# Patient Record
Sex: Male | Born: 1955 | Race: White | Hispanic: No | Marital: Married | Smoking: Never smoker
Health system: Southern US, Community
[De-identification: ages and names within clinical notes are randomized; demographics above are authoritative.]

## PROBLEM LIST (undated history)

## (undated) DIAGNOSIS — T7840XA Allergy, unspecified, initial encounter: Secondary | ICD-10-CM

## (undated) DIAGNOSIS — K219 Gastro-esophageal reflux disease without esophagitis: Secondary | ICD-10-CM

## (undated) DIAGNOSIS — I10 Essential (primary) hypertension: Secondary | ICD-10-CM

## (undated) DIAGNOSIS — G40909 Epilepsy, unspecified, not intractable, without status epilepticus: Secondary | ICD-10-CM

## (undated) DIAGNOSIS — M545 Low back pain, unspecified: Secondary | ICD-10-CM

## (undated) DIAGNOSIS — Z8719 Personal history of other diseases of the digestive system: Secondary | ICD-10-CM

## (undated) HISTORY — DX: Allergy, unspecified, initial encounter: T78.40XA

## (undated) HISTORY — PX: WRIST SURGERY: SHX841

## (undated) HISTORY — DX: Epilepsy, unspecified, not intractable, without status epilepticus: G40.909

## (undated) HISTORY — PX: CHOLECYSTECTOMY: SHX55

## (undated) HISTORY — PX: TOE SURGERY: SHX1073

## (undated) HISTORY — PX: COLONOSCOPY: SHX5424

## (undated) HISTORY — DX: Low back pain, unspecified: M54.50

## (undated) HISTORY — PX: CARDIAC CATHETERIZATION: SHX172

## (undated) HISTORY — DX: Low back pain: M54.5

---
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
## 1998-11-07 HISTORY — PX: AMPUTATION: SHX166

## 2016-04-26 ENCOUNTER — Other Ambulatory Visit: Payer: Self-pay

## 2016-04-27 ENCOUNTER — Telehealth: Payer: Self-pay | Admitting: Vascular Surgery

## 2016-04-27 NOTE — Telephone Encounter (Signed)
-----   Message from Phillips Odorarol S Pullins, RN sent at 04/26/2016 12:05 PM EDT ----- Regarding: needs appt. with Dr. Arbie CookeyEarly Please schedule this pt. for a new pt. Consult with Dr. Arbie CookeyEarly, prior to ALIF 05/18/16.  (Dr. Arbie CookeyEarly will be assisting Dr. Shon BatonBrooks)  Please remind the pt. To bring copy of LS spine films to appt.

## 2016-04-27 NOTE — Telephone Encounter (Signed)
Scheduled for consult for ALIF on 05/18/16 LVM on mobile #

## 2016-05-02 ENCOUNTER — Encounter: Payer: Self-pay | Admitting: Vascular Surgery

## 2016-05-03 ENCOUNTER — Ambulatory Visit (INDEPENDENT_AMBULATORY_CARE_PROVIDER_SITE_OTHER): Payer: BLUE CROSS/BLUE SHIELD | Admitting: Vascular Surgery

## 2016-05-03 ENCOUNTER — Encounter: Payer: Self-pay | Admitting: Vascular Surgery

## 2016-05-03 VITALS — BP 151/101 | HR 92 | Temp 97.2°F | Resp 18 | Ht 68.5 in | Wt 226.3 lb

## 2016-05-03 DIAGNOSIS — M5136 Other intervertebral disc degeneration, lumbar region: Secondary | ICD-10-CM

## 2016-05-03 NOTE — Progress Notes (Signed)
Vitals:   05/03/16 1004 05/03/16 1007  BP: (!) 158/100 (!) 151/101  Pulse: 92   Resp: 18   Temp: 97.2 F (36.2 C)   TempSrc: Oral   SpO2: 96%   Weight: 226 lb 4.8 oz (102.6 kg)   Height: 5' 8.5" (1.74 m)

## 2016-05-03 NOTE — Progress Notes (Signed)
Patient name: Phillip CheersLarry Hendrix MRN: [id number] DOB: 07/24/1956 Sex: male  REASON FOR CONSULT: Discussed anterior exposure for L4-5 degenerative disc disease  HPI: Phillip Hendrix is a 60 y.o. male, here today for discussion of anterior exposure for L4-5 disc surgery. He is had failure of conservative treatment. This is included several courses of injections. He reports this pain is been present for several years but is been much more intense over the past 9 months. This is in his low back and extends mainly down into his left leg. This does make it difficult for him to do his routine daily activities and work issues. He is been seen by Dr. Shon BatonBrooks was recommended anterior exposure for L4-5 disc surgery. He has had no prior abdominal surgery to include appendectomy in 1972 and laparoscopic cholecystectomy. No other intra-abdominal surgery. He has no history of heart disease and no other major medical difficulties.  Past Medical History:  Diagnosis Date  . Allergy   . Low back pain   . Seizure disorder (HCC)    07-2013, 1995    Family History  Problem Relation Age of Onset  . Heart disease Mother   . Cancer Sister   . Diabetes Sister   . Diabetes Brother   . Cancer Maternal Grandfather     SOCIAL HISTORY: Social History   Social History  . Marital status: Married    Spouse name: N/A  . Number of children: N/A  . Years of education: N/A   Occupational History  . Not on file.   Social History Main Topics  . Smoking status: Never Smoker  . Smokeless tobacco: Never Used  . Alcohol use No  . Drug use: No  . Sexual activity: Not on file   Other Topics Concern  . Not on file   Social History Narrative  . No narrative on file    Allergies  Allergen Reactions  . Celecoxib Rash    Current Outpatient Prescriptions  Medication Sig Dispense Refill  . allopurinol (ZYLOPRIM) 100 MG tablet TAKE 1 TABLET BY MOUTH EVERY MORNING FOR 1 WEEK THEN TAKE 2 TABLETS BY MOUTH EVERY MORNING  11   . HYDROcodone-acetaminophen (NORCO) 10-325 MG tablet TAKE 1 TABLET BY MOUTH 4 TIMES A DAY AS NEEDED  0  . loratadine (CLARITIN) 10 MG tablet Take 10 mg by mouth.    . pantoprazole (PROTONIX) 40 MG tablet Take 40 mg by mouth daily.  3  . Potassium 99 MG TABS Take by mouth.    . zolpidem (AMBIEN) 10 MG tablet Take 10 mg by mouth at bedtime.  5   No current facility-administered medications for this visit.     REVIEW OF SYSTEMS:  [X]  denotes positive finding, [ ]  denotes negative finding Cardiac  Comments:  Chest pain or chest pressure:    Shortness of breath upon exertion:    Short of breath when lying flat:    Irregular heart rhythm:        Vascular    Pain in calf, thigh, or hip brought on by ambulation: x   Pain in feet at night that wakes you up from your sleep:     Blood clot in your veins:    Leg swelling:         Pulmonary    Oxygen at home:    Productive cough:     Wheezing:         Neurologic    Sudden weakness in arms or legs:  Sudden numbness in arms or legs:     Sudden onset of difficulty speaking or slurred speech:    Temporary loss of vision in one eye:     Problems with dizziness:         Gastrointestinal    Blood in stool:     Vomited blood:         Genitourinary    Burning when urinating:     Blood in urine:        Psychiatric    Major depression:         Hematologic    Bleeding problems:    Problems with blood clotting too easily:        Skin    Rashes or ulcers:        Constitutional    Fever or chills:      PHYSICAL EXAM: Vitals:   05/03/16 1004 05/03/16 1007  BP: (!) 158/100 (!) 151/101  Pulse: 92   Resp: 18   Temp: 97.2 F (36.2 C)   TempSrc: Oral   SpO2: 96%   Weight: 102.6 kg (226 lb 4.8 oz)   Height: 5' 8.5" (1.74 m)     GENERAL: The patient is a well-nourished male, in no acute distress. The vital signs are documented above. CARDIAC: There is a regular rate and rhythm.  VASCULAR: 2+ right radial pulse and 2+  dorsalis pedis pulses bilaterally PULMONARY: There is good air exchange bilaterally without wheezing or rales. ABDOMEN: Soft and non-tender with normal pitched bowel sounds. No masses noted. MUSCULOSKELETAL: Left forearm amputation. NEUROLOGIC: No focal weakness or paresthesias are detected. SKIN: There are no ulcers or rashes noted. PSYCHIATRIC: The patient has a normal affect.  DATA:   I do not have any imaging studies from [facility] orthopedics. Will obtain these. He does not know if he has had an MR of his back or CT scan. Does note he has had plain films.  MEDICAL ISSUES:  Degenerative disc disease with failure of conservative treatment. I explained my role for anterior exposure. Explain mobilization of intraperitoneal contents, left ureter and arterial venous structures in the potential injury of all of these. We'll obtain films from [facility] orthopedics prior to surgery. Explained I do not see any contraindication or concern assuming he does not have extreme calcification of his aorta. This would be unlikely since he is never smoked and has a normal distal pulses. We'll plan surgery on 05/18/2016 as scheduled   Zamiyah Resendes [facility] [employer] [facility] [phone]

## 2016-05-09 NOTE — Pre-Procedure Instructions (Signed)
Phillip BuccoLarry E Hendrix  05/09/2016      [id number] [place], [place] - [address] [place] [id number] Phone: [phone] Fax: [phone]    Your procedure is scheduled on Oct. 11  [facility] Admitting at 800 A.M.  Call this number if you have problems the morning of surgery:  [phone]   Remember:  Do not eat food or drink liquids after midnight.  Take these medicines the morning of surgery with A SIP OF WATER Allopurinol (Zyloprim), Hydrocodone (Norco) if needed, Loratadine (Claritin), Pantoprazole (Protonix)  Stop taking aspirin, BC's, Goody's, Herbal medications, Fish Oil, Aleve, Ibuprofen, Advil, Motrin, Vitamins    Do not wear jewelry, make-up or nail polish.  Do not wear lotions, powders, or perfumes, or deoderant.  Do not shave 48 hours prior to surgery.  Men may shave face and neck.  Do not bring valuables to the hospital.  [facility] is not responsible for any belongings or valuables.  Contacts, dentures or bridgework may not be worn into surgery.  Leave your suitcase in the car.  After surgery it may be brought to your room.  For patients admitted to the hospital, discharge time will be determined by your treatment team.  Patients discharged the day of surgery will not be allowed to drive home.   Special instructions:  [place] - Preparing for Surgery  Before surgery, you can play an important role.  Because skin is not sterile, your skin needs to be as free of germs as possible.  You can reduce the number of germs on you skin by washing with CHG (chlorahexidine gluconate) soap before surgery.  CHG is an antiseptic cleaner which kills germs and bonds with the skin to continue killing germs even after washing.  Please DO NOT use if you have an allergy to CHG or antibacterial soaps.  If your skin becomes reddened/irritated stop using the CHG and inform your nurse when you arrive at Short Stay.  Do not shave  (including legs and underarms) for at least 48 hours prior to the first CHG shower.  You may shave your face.  Please follow these instructions carefully:   1.  Shower with CHG Soap the night before surgery and the    morning of Surgery.  2.  If you choose to wash your hair, wash your hair first as usual with your   normal shampoo.  3.  After you shampoo, rinse your hair and body thoroughly to remove the  Shampoo.  4.  Use CHG as you would any other liquid soap.  You can apply chg directly to the skin and wash gently with scrungie or a clean washcloth.  5.  Apply the CHG Soap to your body ONLY FROM THE NECK DOWN.    Do not use on open wounds or open sores.  Avoid contact with your eyes,       ears, mouth and genitals (private parts).  Wash genitals (private parts) with your normal soap.  6.  Wash thoroughly, paying special attention to the area where your surgery will be performed.  7.  Thoroughly rinse your body with warm water from the neck down.  8.  DO NOT shower/wash with your normal soap after using and rinsing off  the CHG Soap.  9.  Pat yourself dry with a clean towel.            10.  Wear clean pajamas.  11.  Place clean sheets on your bed the night of your first shower and do not sleep with pets.  Day of Surgery  Do not apply any lotions/deoderants the morning of surgery.  Please wear clean clothes to the hospital/surgery center.     Please read over the following fact sheets that you were given. Pain Booklet, MRSA Information and Surgical Site Infection Prevention

## 2016-05-10 ENCOUNTER — Encounter (HOSPITAL_COMMUNITY)
Admission: RE | Admit: 2016-05-10 | Discharge: 2016-05-10 | Disposition: A | Discharge status: Home / Self Care | Payer: BLUE CROSS/BLUE SHIELD | Source: Ambulatory Visit | Attending: Orthopedic Surgery | Admitting: Orthopedic Surgery

## 2016-05-10 ENCOUNTER — Encounter (HOSPITAL_COMMUNITY): Payer: Self-pay

## 2016-05-10 DIAGNOSIS — M5136 Other intervertebral disc degeneration, lumbar region: Secondary | ICD-10-CM | POA: Insufficient documentation

## 2016-05-10 DIAGNOSIS — Z01810 Encounter for preprocedural cardiovascular examination: Secondary | ICD-10-CM | POA: Insufficient documentation

## 2016-05-10 DIAGNOSIS — Z01812 Encounter for preprocedural laboratory examination: Secondary | ICD-10-CM | POA: Insufficient documentation

## 2016-05-10 HISTORY — DX: Gastro-esophageal reflux disease without esophagitis: K21.9

## 2016-05-10 HISTORY — DX: Essential (primary) hypertension: I10

## 2016-05-10 HISTORY — DX: Personal history of other diseases of the digestive system: Z87.19

## 2016-05-10 LAB — BASIC METABOLIC PANEL
ANION GAP: 8 (ref 5–15)
BUN: 7 mg/dL (ref 6–20)
CALCIUM: 9.3 mg/dL (ref 8.9–10.3)
CO2: 25 mmol/L (ref 22–32)
CREATININE: 0.9 mg/dL (ref 0.61–1.24)
Chloride: 101 mmol/L (ref 101–111)
GFR calc Af Amer: >60 mL/min (ref >60)
GLUCOSE: 101 mg/dL — AB (ref 65–99)
Potassium: 4.1 mmol/L (ref 3.5–5.1)
Sodium: 134 mmol/L — ABNORMAL LOW (ref 135–145)

## 2016-05-10 LAB — CBC
HEMATOCRIT: 44.5 % (ref 39.0–52.0)
Hemoglobin: 15.4 g/dL (ref 13.0–17.0)
MCH: 32.6 pg (ref 26.0–34.0)
MCHC: 34.6 g/dL (ref 30.0–36.0)
MCV: 94.1 fL (ref 78.0–100.0)
PLATELETS: 149 10*3/uL — AB (ref 150–400)
RBC: 4.73 MIL/uL (ref 4.22–5.81)
RDW: 12.5 % (ref 11.5–15.5)
WBC: 8 10*3/uL (ref 4.0–10.5)

## 2016-05-10 LAB — SURGICAL PCR SCREEN
MRSA, PCR: NEGATIVE
Staphylococcus aureus: NEGATIVE

## 2016-05-10 NOTE — Progress Notes (Signed)
   05/10/16 1212  OBSTRUCTIVE SLEEP APNEA  Have you ever been diagnosed with sleep apnea through a sleep study? No  Do you snore loudly (loud enough to be heard through closed doors)?  0  Do you often feel tired, fatigued, or sleepy during the daytime (such as falling asleep during driving or talking to someone)? 0  Has anyone observed you stop breathing during your sleep? 1  Do you have, or are you being treated for high blood pressure? 0  BMI more than 35 kg/m2? 1  Age > 50 (1-yes) 1  Neck circumference greater than:Male 16 inches or larger, Male 17inches or larger? 1  Male Gender (Yes=1) 1  Obstructive Sleep Apnea Score 5  Score 5 or greater  Results sent to PCP

## 2016-05-10 NOTE — Progress Notes (Addendum)
PCP is Dr Council MechanicPatrick Hendrix Denies ever seeing a cardiologist. Denies having any recent chest pain. Denies ever having a  Recent card cath, stress test and echo., states he had a card cath and stress test 10 years ago or more.

## 2016-05-17 ENCOUNTER — Encounter (HOSPITAL_COMMUNITY): Payer: Self-pay | Admitting: Anesthesiology

## 2016-05-18 ENCOUNTER — Encounter (HOSPITAL_COMMUNITY): Payer: Self-pay | Admitting: *Deleted

## 2016-05-18 ENCOUNTER — Inpatient Hospital Stay (HOSPITAL_COMMUNITY): Payer: BLUE CROSS/BLUE SHIELD | Admitting: Anesthesiology

## 2016-05-18 ENCOUNTER — Inpatient Hospital Stay (HOSPITAL_COMMUNITY): Payer: BLUE CROSS/BLUE SHIELD

## 2016-05-18 ENCOUNTER — Encounter (HOSPITAL_COMMUNITY)
Admission: RE | Disposition: A | Discharge status: Home / Self Care | Payer: Self-pay | Source: Ambulatory Visit | Attending: Orthopedic Surgery

## 2016-05-18 ENCOUNTER — Inpatient Hospital Stay (HOSPITAL_COMMUNITY)
Admission: RE | Admit: 2016-05-18 | Discharge: 2016-05-19 | DRG: 460 | Disposition: A | Discharge status: Home / Self Care | Payer: BLUE CROSS/BLUE SHIELD | Source: Ambulatory Visit | Attending: Orthopedic Surgery | Admitting: Orthopedic Surgery

## 2016-05-18 DIAGNOSIS — M5116 Intervertebral disc disorders with radiculopathy, lumbar region: Secondary | ICD-10-CM | POA: Diagnosis present

## 2016-05-18 DIAGNOSIS — M549 Dorsalgia, unspecified: Secondary | ICD-10-CM | POA: Diagnosis present

## 2016-05-18 DIAGNOSIS — I10 Essential (primary) hypertension: Secondary | ICD-10-CM | POA: Diagnosis present

## 2016-05-18 DIAGNOSIS — M545 Low back pain: Secondary | ICD-10-CM | POA: Diagnosis present

## 2016-05-18 DIAGNOSIS — T849XXA Unspecified complication of internal orthopedic prosthetic device, implant and graft, initial encounter: Secondary | ICD-10-CM

## 2016-05-18 DIAGNOSIS — K219 Gastro-esophageal reflux disease without esophagitis: Secondary | ICD-10-CM | POA: Diagnosis present

## 2016-05-18 DIAGNOSIS — M48061 Spinal stenosis, lumbar region without neurogenic claudication: Secondary | ICD-10-CM | POA: Diagnosis present

## 2016-05-18 DIAGNOSIS — Z6834 Body mass index (BMI) 34.0-34.9, adult: Secondary | ICD-10-CM | POA: Diagnosis not present

## 2016-05-18 DIAGNOSIS — E669 Obesity, unspecified: Secondary | ICD-10-CM | POA: Diagnosis present

## 2016-05-18 DIAGNOSIS — Z79899 Other long term (current) drug therapy: Secondary | ICD-10-CM | POA: Diagnosis not present

## 2016-05-18 DIAGNOSIS — IMO0001 Reserved for inherently not codable concepts without codable children: Secondary | ICD-10-CM

## 2016-05-18 DIAGNOSIS — Z419 Encounter for procedure for purposes other than remedying health state, unspecified: Secondary | ICD-10-CM

## 2016-05-18 SURGERY — POSTERIOR LUMBAR FUSION 1 LEVEL
Anesthesia: General

## 2016-05-18 MED ORDER — FENTANYL CITRATE (PF) 100 MCG/2ML IJ SOLN
INTRAMUSCULAR | Status: AC
  Filled 2016-05-18: qty 2

## 2016-05-18 MED ORDER — CEFAZOLIN SODIUM-DEXTROSE 2-3 GM-% IV SOLR
INTRAVENOUS | Status: DC | PRN
  Administered 2016-05-18 (×2): 2 g via INTRAVENOUS

## 2016-05-18 MED ORDER — FENTANYL CITRATE (PF) 100 MCG/2ML IJ SOLN
INTRAMUSCULAR | Status: DC | PRN
  Administered 2016-05-18 (×4): 50 ug via INTRAVENOUS
  Administered 2016-05-18: 100 ug via INTRAVENOUS

## 2016-05-18 MED ORDER — THROMBIN 20000 UNITS EX SOLR
CUTANEOUS | Status: DC | PRN
  Administered 2016-05-18: 20000 [IU] via TOPICAL

## 2016-05-18 MED ORDER — FENTANYL CITRATE (PF) 100 MCG/2ML IJ SOLN
INTRAMUSCULAR | Status: AC
  Filled 2016-05-18: qty 4

## 2016-05-18 MED ORDER — SODIUM CHLORIDE 0.9% FLUSH: 3.0000 mL | INTRAVENOUS | Status: DC | PRN

## 2016-05-18 MED ORDER — METHOCARBAMOL 1000 MG/10ML IJ SOLN
500.0000 mg | Freq: Four times a day (QID) | INTRAVENOUS | Status: DC | PRN
  Filled 2016-05-18: qty 5

## 2016-05-18 MED ORDER — METHOCARBAMOL 500 MG PO TABS
500.0000 mg | ORAL_TABLET | Freq: Four times a day (QID) | ORAL | Status: DC | PRN
  Administered 2016-05-18 – 2016-05-19 (×3): 500 mg via ORAL
  Filled 2016-05-18 (×2): qty 1

## 2016-05-18 MED ORDER — LACTATED RINGERS IV SOLN
INTRAVENOUS | Status: DC
Start: 2016-05-18 — End: 2016-05-18
  Administered 2016-05-18: 08:00:00 via INTRAVENOUS

## 2016-05-18 MED ORDER — ONDANSETRON HCL 4 MG/2ML IJ SOLN: 4.0000 mg | INTRAMUSCULAR | Status: DC | PRN

## 2016-05-18 MED ORDER — MIDAZOLAM HCL 5 MG/5ML IJ SOLN
INTRAMUSCULAR | Status: DC | PRN
  Administered 2016-05-18: 2 mg via INTRAVENOUS

## 2016-05-18 MED ORDER — PROMETHAZINE HCL 25 MG/ML IJ SOLN
6.2500 mg | INTRAMUSCULAR | Status: DC | PRN
Start: 2016-05-18 — End: 2016-05-18

## 2016-05-18 MED ORDER — PROPOFOL 10 MG/ML IV BOLUS
INTRAVENOUS | Status: AC
  Filled 2016-05-18: qty 20

## 2016-05-18 MED ORDER — LACTATED RINGERS IV SOLN
INTRAVENOUS | Status: DC | PRN
  Administered 2016-05-18 (×5): via INTRAVENOUS

## 2016-05-18 MED ORDER — BUPIVACAINE-EPINEPHRINE 0.25% -1:200000 IJ SOLN
INTRAMUSCULAR | Status: AC
  Filled 2016-05-18: qty 1

## 2016-05-18 MED ORDER — MIDAZOLAM HCL 2 MG/2ML IJ SOLN
INTRAMUSCULAR | Status: AC
  Filled 2016-05-18: qty 2

## 2016-05-18 MED ORDER — PHENOL 1.4 % MT LIQD: 1.0000 | OROMUCOSAL | Status: DC | PRN

## 2016-05-18 MED ORDER — ALLOPURINOL 100 MG PO TABS
200.0000 mg | ORAL_TABLET | Freq: Every morning | ORAL | Status: DC
  Administered 2016-05-19: 200 mg via ORAL
  Filled 2016-05-18: qty 2

## 2016-05-18 MED ORDER — PROPOFOL 10 MG/ML IV BOLUS
INTRAVENOUS | Status: DC | PRN
  Administered 2016-05-18: 150 mg via INTRAVENOUS

## 2016-05-18 MED ORDER — DEXAMETHASONE 4 MG PO TABS
4.0000 mg | ORAL_TABLET | Freq: Four times a day (QID) | ORAL | Status: DC
  Administered 2016-05-18 – 2016-05-19 (×3): 4 mg via ORAL
  Filled 2016-05-18 (×3): qty 1

## 2016-05-18 MED ORDER — LACTATED RINGERS IV SOLN: INTRAVENOUS | Status: DC

## 2016-05-18 MED ORDER — CHLORHEXIDINE GLUCONATE 4 % EX LIQD: 60.0000 mL | Freq: Once | CUTANEOUS | Status: DC

## 2016-05-18 MED ORDER — ONDANSETRON HCL 4 MG/2ML IJ SOLN
INTRAMUSCULAR | Status: AC
  Filled 2016-05-18: qty 2

## 2016-05-18 MED ORDER — CEFAZOLIN SODIUM-DEXTROSE 2-4 GM/100ML-% IV SOLN
2.0000 g | INTRAVENOUS | Status: DC
  Filled 2016-05-18: qty 100

## 2016-05-18 MED ORDER — DEXAMETHASONE SODIUM PHOSPHATE 4 MG/ML IJ SOLN: 4.0000 mg | Freq: Four times a day (QID) | INTRAMUSCULAR | Status: DC

## 2016-05-18 MED ORDER — HEMOSTATIC AGENTS (NO CHARGE) OPTIME
TOPICAL | Status: DC | PRN
  Administered 2016-05-18: 1 via TOPICAL

## 2016-05-18 MED ORDER — HYDROMORPHONE HCL 1 MG/ML IJ SOLN
INTRAMUSCULAR | Status: AC
  Administered 2016-05-18: 0.5 mg via INTRAVENOUS
  Filled 2016-05-18: qty 1

## 2016-05-18 MED ORDER — PANTOPRAZOLE SODIUM 40 MG PO TBEC
40.0000 mg | DELAYED_RELEASE_TABLET | Freq: Every day | ORAL | Status: DC
Start: 2016-05-18 — End: 2016-05-19
  Administered 2016-05-18: 40 mg via ORAL
  Filled 2016-05-18: qty 1

## 2016-05-18 MED ORDER — PHENYLEPHRINE 40 MCG/ML (10ML) SYRINGE FOR IV PUSH (FOR BLOOD PRESSURE SUPPORT)
PREFILLED_SYRINGE | INTRAVENOUS | Status: AC
  Filled 2016-05-18: qty 20

## 2016-05-18 MED ORDER — SUCCINYLCHOLINE CHLORIDE 200 MG/10ML IV SOSY
PREFILLED_SYRINGE | INTRAVENOUS | Status: AC
  Filled 2016-05-18: qty 10

## 2016-05-18 MED ORDER — BUPIVACAINE-EPINEPHRINE 0.25% -1:200000 IJ SOLN
INTRAMUSCULAR | Status: DC | PRN
  Administered 2016-05-18: 20 mL

## 2016-05-18 MED ORDER — PHENYLEPHRINE HCL 10 MG/ML IJ SOLN
INTRAVENOUS | Status: DC | PRN
  Administered 2016-05-18: 25 ug/min via INTRAVENOUS

## 2016-05-18 MED ORDER — LIDOCAINE 2% (20 MG/ML) 5 ML SYRINGE
INTRAMUSCULAR | Status: AC
  Filled 2016-05-18: qty 5

## 2016-05-18 MED ORDER — SURGIFOAM 100 EX MISC
CUTANEOUS | Status: DC | PRN
  Administered 2016-05-18: 1 via TOPICAL

## 2016-05-18 MED ORDER — ONDANSETRON HCL 4 MG/2ML IJ SOLN
INTRAMUSCULAR | Status: DC | PRN
  Administered 2016-05-18: 4 mg via INTRAVENOUS

## 2016-05-18 MED ORDER — OXYCODONE HCL 5 MG PO TABS
ORAL_TABLET | ORAL | Status: AC
  Filled 2016-05-18: qty 2

## 2016-05-18 MED ORDER — OXYCODONE HCL 5 MG PO TABS
10.0000 mg | ORAL_TABLET | ORAL | Status: DC | PRN
  Administered 2016-05-18 – 2016-05-19 (×5): 10 mg via ORAL
  Filled 2016-05-18 (×4): qty 2

## 2016-05-18 MED ORDER — SUCCINYLCHOLINE CHLORIDE 20 MG/ML IJ SOLN
INTRAMUSCULAR | Status: DC | PRN
  Administered 2016-05-18: 100 mg via INTRAVENOUS

## 2016-05-18 MED ORDER — METHOCARBAMOL 500 MG PO TABS
ORAL_TABLET | ORAL | Status: AC
  Filled 2016-05-18: qty 1

## 2016-05-18 MED ORDER — 0.9 % SODIUM CHLORIDE (POUR BTL) OPTIME
TOPICAL | Status: DC | PRN
  Administered 2016-05-18 (×3): 1000 mL

## 2016-05-18 MED ORDER — CEFAZOLIN IN D5W 1 GM/50ML IV SOLN
1.0000 g | Freq: Three times a day (TID) | INTRAVENOUS | Status: AC
  Administered 2016-05-18 – 2016-05-19 (×2): 1 g via INTRAVENOUS
  Filled 2016-05-18 (×2): qty 50

## 2016-05-18 MED ORDER — SODIUM CHLORIDE 0.9 % IV SOLN: 250.0000 mL | INTRAVENOUS | Status: DC

## 2016-05-18 MED ORDER — SODIUM CHLORIDE 0.9% FLUSH
3.0000 mL | Freq: Two times a day (BID) | INTRAVENOUS | Status: DC
  Administered 2016-05-18: 3 mL via INTRAVENOUS

## 2016-05-18 MED ORDER — PHENYLEPHRINE HCL 10 MG/ML IJ SOLN
INTRAMUSCULAR | Status: DC | PRN
  Administered 2016-05-18: 80 ug via INTRAVENOUS

## 2016-05-18 MED ORDER — HYDROMORPHONE HCL 1 MG/ML IJ SOLN
0.2500 mg | INTRAMUSCULAR | Status: DC | PRN
  Administered 2016-05-18 (×4): 0.5 mg via INTRAVENOUS

## 2016-05-18 MED ORDER — LIDOCAINE HCL (CARDIAC) 20 MG/ML IV SOLN
INTRAVENOUS | Status: DC | PRN
  Administered 2016-05-18: 50 mg via INTRAVENOUS

## 2016-05-18 MED ORDER — PROPOFOL 500 MG/50ML IV EMUL
INTRAVENOUS | Status: DC | PRN
  Administered 2016-05-18: 75 ug/kg/min via INTRAVENOUS
  Administered 2016-05-18: 12:00:00 via INTRAVENOUS

## 2016-05-18 MED ORDER — MORPHINE SULFATE (PF) 2 MG/ML IV SOLN
1.0000 mg | INTRAVENOUS | Status: DC | PRN
  Administered 2016-05-18: 4 mg via INTRAVENOUS
  Filled 2016-05-18: qty 2

## 2016-05-18 MED ORDER — MENTHOL 3 MG MT LOZG: 1.0000 | LOZENGE | OROMUCOSAL | Status: DC | PRN

## 2016-05-18 MED ORDER — GLYCOPYRROLATE 0.2 MG/ML IV SOSY
PREFILLED_SYRINGE | INTRAVENOUS | Status: AC
  Filled 2016-05-18: qty 3

## 2016-05-18 MED ORDER — THROMBIN 20000 UNITS EX SOLR
CUTANEOUS | Status: AC
  Filled 2016-05-18: qty 20000

## 2016-05-18 SURGICAL SUPPLY — 76 items
BLADE SURG ROTATE 9660 (MISCELLANEOUS) IMPLANT
BUR EGG ELITE 4.0 (BURR) IMPLANT
BUR EGG ELITE 4.0MM (BURR)
BUR ROUND FLUTED 4 SOFT TCH (BURR) ×2 IMPLANT
BUR ROUND FLUTED 4MM SOFT TCH (BURR) ×1
CAGE TLIF NANOLOCK 11 (Cage) ×2 IMPLANT
CAGE TLIF NANOLOCK 11MM (Cage) ×1 IMPLANT
CANISTER SUCT 3000ML (MISCELLANEOUS) ×3 IMPLANT
CLIP NEUROVISION LG (CLIP) ×3 IMPLANT
CLOSURE STERI-STRIP 1/2X4 (GAUZE/BANDAGES/DRESSINGS) ×1
CLOSURE WOUND 1/2 X4 (GAUZE/BANDAGES/DRESSINGS) ×2
CLSR STERI-STRIP ANTIMIC 1/2X4 (GAUZE/BANDAGES/DRESSINGS) ×2 IMPLANT
COVER SURGICAL LIGHT HANDLE (MISCELLANEOUS) ×3 IMPLANT
DRAPE C-ARM 42X72 X-RAY (DRAPES) ×3 IMPLANT
DRAPE C-ARMOR (DRAPES) ×3 IMPLANT
DRAPE POUCH INSTRU U-SHP 10X18 (DRAPES) ×3 IMPLANT
DRAPE SURG 17X23 STRL (DRAPES) ×3 IMPLANT
DRAPE U-SHAPE 47X51 STRL (DRAPES) ×3 IMPLANT
DRSG AQUACEL AG ADV 3.5X 6 (GAUZE/BANDAGES/DRESSINGS) ×6 IMPLANT
DRSG AQUACEL AG ADV 3.5X10 (GAUZE/BANDAGES/DRESSINGS) ×3 IMPLANT
DURAPREP 26ML APPLICATOR (WOUND CARE) ×3 IMPLANT
ELECT BLADE 4.0 EZ CLEAN MEGAD (MISCELLANEOUS) ×3
ELECT BLADE 6.5 EXT (BLADE) IMPLANT
ELECT PENCIL ROCKER SW 15FT (MISCELLANEOUS) ×3 IMPLANT
ELECT REM PT RETURN 9FT ADLT (ELECTROSURGICAL) ×3
ELECTRODE BLDE 4.0 EZ CLN MEGD (MISCELLANEOUS) ×1 IMPLANT
ELECTRODE REM PT RTRN 9FT ADLT (ELECTROSURGICAL) ×1 IMPLANT
GLOVE BIOGEL PI IND STRL 8.5 (GLOVE) ×1 IMPLANT
GLOVE BIOGEL PI INDICATOR 8.5 (GLOVE) ×2
GLOVE SS BIOGEL STRL SZ 8.5 (GLOVE) ×1 IMPLANT
GLOVE SUPERSENSE BIOGEL SZ 8.5 (GLOVE) ×2
GOWN STRL REUS W/ TWL LRG LVL3 (GOWN DISPOSABLE) ×2 IMPLANT
GOWN STRL REUS W/TWL 2XL LVL3 (GOWN DISPOSABLE) ×3 IMPLANT
GOWN STRL REUS W/TWL LRG LVL3 (GOWN DISPOSABLE) ×4
GUIDEWIRE NITINOL BEVEL TIP (WIRE) ×12 IMPLANT
KIT BASIN OR (CUSTOM PROCEDURE TRAY) ×3 IMPLANT
KIT POSITION SURG JACKSON T1 (MISCELLANEOUS) IMPLANT
KIT ROOM TURNOVER OR (KITS) ×3 IMPLANT
MODULE NVM5 NEXT GEN EMG (NEEDLE) ×3 IMPLANT
NEEDLE 22X1 1/2 (OR ONLY) (NEEDLE) ×3 IMPLANT
NEEDLE I-PASS III (NEEDLE) ×3 IMPLANT
NEEDLE SPNL 18GX3.5 QUINCKE PK (NEEDLE) ×6 IMPLANT
NS IRRIG 1000ML POUR BTL (IV SOLUTION) ×9 IMPLANT
PACK LAMINECTOMY ORTHO (CUSTOM PROCEDURE TRAY) ×3 IMPLANT
PACK UNIVERSAL I (CUSTOM PROCEDURE TRAY) ×3 IMPLANT
PAD ARMBOARD 7.5X6 YLW CONV (MISCELLANEOUS) ×6 IMPLANT
PATTIES SURGICAL .5 X.5 (GAUZE/BANDAGES/DRESSINGS) IMPLANT
PATTIES SURGICAL .5 X1 (DISPOSABLE) ×3 IMPLANT
POSITIONER HEAD PRONE TRACH (MISCELLANEOUS) ×3 IMPLANT
PROBE BALL TIP NVM5 SNG USE (BALLOONS) ×3 IMPLANT
PUTTY DBX 1CC (Putty) ×3 IMPLANT
PUTTY DBX 1CC DEPUY (Putty) ×1 IMPLANT
REDUCTION EXT RELINE MAS MOD (Neuro Prosthesis/Implant) ×12 IMPLANT
ROD RELINE MAS TI LORD 5.5X40 (Rod) ×6 IMPLANT
SCREW LOCK RELINE 5.5 TULIP (Screw) ×12 IMPLANT
SCREW SHANK MAS MOD 6.5X50MM (Screw) ×6 IMPLANT
SCREW SHANK RELINE 6.5X45MM 2C (Screw) ×6 IMPLANT
SHEET CONFORM 45LX20WX5H (Bone Implant) ×3 IMPLANT
SPONGE LAP 4X18 X RAY DECT (DISPOSABLE) ×12 IMPLANT
SPONGE SURGIFOAM ABS GEL 100 (HEMOSTASIS) ×3 IMPLANT
STRIP CLOSURE SKIN 1/2X4 (GAUZE/BANDAGES/DRESSINGS) ×4 IMPLANT
SURGIFLO W/THROMBIN 8M KIT (HEMOSTASIS) ×3 IMPLANT
SUT BONE WAX W31G (SUTURE) ×3 IMPLANT
SUT MNCRL AB 3-0 PS2 18 (SUTURE) ×6 IMPLANT
SUT VIC AB 0 CT1 27 (SUTURE) ×4
SUT VIC AB 0 CT1 27XBRD ANBCTR (SUTURE) ×2 IMPLANT
SUT VIC AB 1 CT1 18XCR BRD 8 (SUTURE) ×2 IMPLANT
SUT VIC AB 1 CT1 8-18 (SUTURE) ×4
SUT VIC AB 2-0 CT1 18 (SUTURE) ×3 IMPLANT
SYR BULB IRRIGATION 50ML (SYRINGE) ×3 IMPLANT
SYR CONTROL 10ML LL (SYRINGE) ×3 IMPLANT
TOWEL OR 17X24 6PK STRL BLUE (TOWEL DISPOSABLE) ×3 IMPLANT
TOWEL OR 17X26 10 PK STRL BLUE (TOWEL DISPOSABLE) ×3 IMPLANT
TRAY FOLEY CATH 16FRSI W/METER (SET/KITS/TRAYS/PACK) ×3 IMPLANT
WATER STERILE IRR 1000ML POUR (IV SOLUTION) ×3 IMPLANT
YANKAUER SUCT BULB TIP NO VENT (SUCTIONS) ×3 IMPLANT

## 2016-05-18 NOTE — H&P (Signed)
History of Present Illness  The patient is a 60 year old male who comes in today for a preoperative History and Physical. The patient is scheduled for a TLIFL4-5 to be performed by Dr. Debria Garretahari D. Shon BatonBrooks, MD at [facility] on 05/18/2016 . Please see the hospital record for complete dictated history and physical. Pt reports hx of good health.  Additional reasons for visit:  Transition into care is described as the following: The patient is transitioning into care and a summary of care was reviewed.   Problem List/Past Medical  Lumbar spinal stenosis (M48.07)  Degenerative lumbar disc (M51.36)  Sacroiliac joint pain (M53.3)  Problems Reconciled   Allergies  CeleBREX *ANALGESICS - ANTI-INFLAMMATORY*  Allergies Reconciled   Family History  Cancer  Maternal Grandfather, Sister.  Social History  Tobacco use  Never smoker. 10/08/2015 Tobacco / smoke exposure  10/08/2015: no Children  0 Current work status  working full time Exercise  Exercises daily; does running / walking Former drinker  10/08/2015: In the past drank Living situation  live with spouse Marital status  married No history of drug/alcohol rehab  Not under pain contract  Number of flights of stairs before winded  4-5  Medication History  Norco (10-325MG  Tablet, 1 (one) Oral daily, Taken starting 05/06/2016) Active. ("usually twice") Robaxin (500MG  Tablet, 1 (one) Oral three times daily, as needed for spasms, Taken starting 02/27/2016) Active. Atorvastatin Calcium (20MG  Tablet, Oral) Active. Allopurinol (100MG  Tablet, Oral) Active. Pantoprazole Sodium (40MG  Tablet DR, Oral) Active. Loratadine (10MG  Tablet, Oral) Active. Zolpidem Tartrate (10MG  Tablet, Oral) Active. (qhs prn) Medications Reconciled  Vitals  05/10/2016 9:05 AM Weight: 235 lb Height: 70in Body Surface Area: 2.24 m Body Mass Index: 33.72 kg/m  Temp.: 97.69F  Pulse: 92 (Regular)  BP: 132/86 (Sitting,  Left Arm, Standard)  Physical Exam  General General Appearance-Not in acute distress. Orientation-Oriented X3. Build & Nutrition-Well nourished and Well developed.  Integumentary General Characteristics Surgical Scars - no surgical scar evidence of previous lumbar surgery. Lumbar Spine-Skin examination of the lumbar spine is without deformity, skin lesions, lacerations or abrasions.  Chest and Lung Exam Auscultation Breath sounds - Normal and Clear.  Cardiovascular Auscultation Rhythm - Regular rate and rhythm.  Abdomen Palpation/Percussion Palpation and Percussion of the abdomen reveal - Soft, Non Tender and No Rebound tenderness.  Peripheral Vascular Lower Extremity Palpation - Posterior tibial pulse - Bilateral - 2+. Dorsalis pedis pulse - Bilateral - 2+.  Neurologic Sensation Lower Extremity - Bilateral - sensation is intact in the lower extremity. Reflexes Patellar Reflex - Bilateral - 2+. Achilles Reflex - Bilateral - 2+. Clonus - Bilateral - clonus not present. Hoffman's Sign - Bilateral - Hoffman's sign not present. Testing Seated Straight Leg Raise - Left - Seated straight leg raise positive. Right - Seated straight leg raise negative.  Musculoskeletal Spine/Ribs/Pelvis  Lumbosacral Spine: Inspection and Palpation - Tenderness - left lumbar paraspinals tender to palpation, right lumbar paraspinals tender to palpation and left buttock is tender to palpation. Strength and Tone: Strength - Hip Flexion - Bilateral - 5/5. Knee Extension - Bilateral - 5/5. Knee Flexion - Bilateral - 5/5. Ankle Dorsiflexion - right- 5/5, trace weakness left. Ankle Plantarflexion - Bilateral - 5/5. Heel walk - Bilateral - able to heel walk with moderate difficulty. Toe Walk - Bilateral - able to walk on toes with moderate difficulty. ROM - Flexion - moderately decreased range of motion and painful. Extension - moderately decreased range of motion and painful. Left Lateral Bending -  moderately decreased range of motion and painful. Right Lateral Bending - moderately decreased range of motion and painful. Right Rotation - moderately decreased range of motion and painful. Left Rotation - moderately decreased range of motion and painful. Pain - . Lumbosacral Spine - Waddell's Signs - no Waddell's signs present. Lower Extremity Range of Motion - No true hip, knee or ankle pain with range of motion. Gait and Station - [employer] - no assistive devices.  MRI: He has mild degenerative disc disease in my opinion with mild bulging, but due to some significant posterior epidural fat, he has spinal stenosis at L5-S1 and L4-5. He has multilevel facet arthropathy at L5-S1, L4-5, and to a lesser extent at L3-4 bilaterally. Signisifcant facet arthropathy L>R.  No significant change on repeat MRI (05/16/16)  Plan: I have gone over his March MRI again. He has significant facet hypertrophy and stenosis causing bilateral recess stenosis as well as encroachment on the L5 nerve roots. Clinically, I am concerned because his overall quality of life is diminished and his pain is increased and his neurologic discomfort has progressed. From my standpoint, I think while the ALIF was a good option when he was having mostly back pain I am concerned now that if the radicular leg pain has become greater. In order to address that I would need to do an extensive decompression. I would have to excise a large segment of both L4-5 facets on either side. The decompressive procedure would be done to alleviate the neuropathic pain. However, as a result of that decompressive procedure more than likely he would end up with instability that would then necessitate instrumentation and fusion. Therefore, I think the better option is to do this all from a posterior approach. I can excise the facet joints and decompress the lateral recess and then stabilize him with an interbody cage and pedicle screw fixation. I have reviewed  this with him in great detail. The risks include infection, bleeding, nerve damage, death, stroke, paralysis, failure to heal and need for further surgery, ongoing or worse pain, loss in bowel or bladder control, need for additional surgery, leak of spinal fluid, nonunion. All of his questions were addressed.

## 2016-05-18 NOTE — Anesthesia Preprocedure Evaluation (Signed)
Anesthesia Evaluation  Patient identified by MRN, date of birth, ID band Patient awake    Reviewed: Allergy & Precautions, NPO status , Patient's Chart, lab work & pertinent test results  Airway Mallampati: II  TM Distance: >3 FB Neck ROM: Full    Dental no notable dental hx.    Pulmonary neg pulmonary ROS,    Pulmonary exam normal breath sounds clear to auscultation       Cardiovascular hypertension, Normal cardiovascular exam Rhythm:Regular Rate:Normal     Neuro/Psych Seizures -,  negative psych ROS   GI/Hepatic Neg liver ROS, hiatal hernia, GERD  ,  Endo/Other  negative endocrine ROS  Renal/GU negative Renal ROS  negative genitourinary   Musculoskeletal negative musculoskeletal ROS (+)   Abdominal (+) + obese,   Peds negative pediatric ROS (+)  Hematology negative hematology ROS (+)   Anesthesia Other Findings   Reproductive/Obstetrics negative OB ROS                             Anesthesia Physical Anesthesia Plan  ASA: II  Anesthesia Plan: General   Post-op Pain Management:    Induction: Intravenous  Airway Management Planned: Oral ETT  Additional Equipment:   Intra-op Plan:   Post-operative Plan: Extubation in OR  Informed Consent: I have reviewed the patients History and Physical, chart, labs and discussed the procedure including the risks, benefits and alternatives for the proposed anesthesia with the patient or authorized representative who has indicated his/her understanding and acceptance.   Dental advisory given  Plan Discussed with: CRNA  Anesthesia Plan Comments:         Anesthesia Quick Evaluation

## 2016-05-18 NOTE — Transfer of Care (Signed)
Immediate Anesthesia Transfer of Care Note  Patient: Phillip BuccoLarry E Hendrix  Procedure(s) Performed: Procedure(s): TRANSFORAMINAL LUMBAR INTERBODY FUSION (TLIF) WITH PEDICLE SCREW FIXATION L4-L5 1 LEVEL (N/A)  Patient Location: PACU  Anesthesia Type:General  Level of Consciousness: awake, alert  and oriented  Airway & Oxygen Therapy: Patient Spontanous Breathing and Patient connected to nasal cannula oxygen  Post-op Assessment: Report given to RN and Post -op Vital signs reviewed and stable  Post vital signs: Reviewed and stable  Last Vitals:  Vitals:   05/18/16 0739  BP: (!) 159/88  Pulse: 98  Resp: 20  Temp: 37.1 C    Last Pain:  Vitals:   05/18/16 0739  TempSrc: Oral         Complications: No apparent anesthesia complications

## 2016-05-18 NOTE — Anesthesia Procedure Notes (Signed)
Procedure Name: Intubation Date/Time: 05/18/2016 9:55 AM Performed by: Gwenyth AllegraADAMI, Tymika Grilli Pre-anesthesia Checklist: Patient identified, Emergency Drugs available, Suction available, Patient being monitored and Timeout performed Patient Re-evaluated:Patient Re-evaluated prior to inductionOxygen Delivery Method: Circle system utilized Preoxygenation: Pre-oxygenation with 100% oxygen Intubation Type: IV induction Ventilation: Mask ventilation without difficulty Laryngoscope Size: Mac and 4 Grade View: Grade II Tube type: Oral Tube size: 7.5 mm Number of attempts: 1 Airway Equipment and Method: Stylet Placement Confirmation: ETT inserted through vocal cords under direct vision,  positive ETCO2 and breath sounds checked- equal and bilateral Secured at: 22 cm Tube secured with: Tape Dental Injury: Teeth and Oropharynx as per pre-operative assessment

## 2016-05-18 NOTE — Anesthesia Postprocedure Evaluation (Signed)
Anesthesia Post Note  Patient: Phillip Hendrix  Procedure(s) Performed: Procedure(s) (LRB): TRANSFORAMINAL LUMBAR INTERBODY FUSION (TLIF) WITH PEDICLE SCREW FIXATION L4-L5 1 LEVEL (N/A)  Patient location during evaluation: PACU Anesthesia Type: General Level of consciousness: awake and alert Pain management: pain level controlled Vital Signs Assessment: post-procedure vital signs reviewed and stable Respiratory status: spontaneous breathing, nonlabored ventilation, respiratory function stable and patient connected to nasal cannula oxygen Cardiovascular status: blood pressure returned to baseline and stable Postop Assessment: no signs of nausea or vomiting Anesthetic complications: no    Last Vitals:  Vitals:   05/18/16 1628 05/18/16 1643  BP: 128/78 (!) 143/80  Pulse: 81 78  Resp: (!) 0 (!) 7  Temp:      Last Pain:  Vitals:   05/18/16 1645  TempSrc:   PainSc: 5                  Saryn Cherry J

## 2016-05-18 NOTE — Brief Op Note (Signed)
05/18/2016  2:57 PM  PATIENT:  Rolan BuccoLarry E Porchia  60 y.o. male  PRE-OPERATIVE DIAGNOSIS:  Facet Arthropathy DDD,Spinal Stenosis,L4-L5  POST-OPERATIVE DIAGNOSIS:  Facet Arthropathy DDD,Spinal Stenosis,L4-L5  PROCEDURE:  Procedure(s): TRANSFORAMINAL LUMBAR INTERBODY FUSION (TLIF) WITH PEDICLE SCREW FIXATION L4-L5 1 LEVEL (N/A)  SURGEON:  Surgeon(s) and Role:    * Venita Lickahari Rejoice Heatwole, MD - Primary  PHYSICIAN ASSISTANT:   ASSISTANTS: Carmen Mayo   ANESTHESIA:   general  EBL:  Total I/O In: 2000 [I.V.:2000] Out: 755 [Urine:305; Blood:450]  BLOOD ADMINISTERED:none  DRAINS: none   LOCAL MEDICATIONS USED:  MARCAINE     SPECIMEN:  No Specimen  DISPOSITION OF SPECIMEN:  N/A  COUNTS:  YES  TOURNIQUET:  * No tourniquets in log *  DICTATION: .Other Dictation: Dictation Number [id number]  PLAN OF CARE: Admit to inpatient   PATIENT DISPOSITION:  PACU - hemodynamically stable.

## 2016-05-19 ENCOUNTER — Inpatient Hospital Stay (HOSPITAL_COMMUNITY): Payer: BLUE CROSS/BLUE SHIELD

## 2016-05-19 MED ORDER — ACETAMINOPHEN 325 MG PO TABS
650.0000 mg | ORAL_TABLET | ORAL | Status: DC | PRN
  Administered 2016-05-19: 650 mg via ORAL
  Filled 2016-05-19: qty 2

## 2016-05-19 MED ORDER — OXYCODONE-ACETAMINOPHEN 10-325 MG PO TABS: 1.0000 | ORAL_TABLET | ORAL | 0 refills | Status: AC | PRN

## 2016-05-19 MED ORDER — METHOCARBAMOL 500 MG PO TABS: 500.0000 mg | ORAL_TABLET | Freq: Four times a day (QID) | ORAL | 0 refills | Status: AC | PRN

## 2016-05-19 MED ORDER — ONDANSETRON 4 MG PO TBDP: 4.0000 mg | ORAL_TABLET | Freq: Three times a day (TID) | ORAL | 0 refills | Status: AC | PRN

## 2016-05-19 NOTE — Progress Notes (Signed)
    Subjective: 1 Day Post-Op Procedure(s) (LRB): TRANSFORAMINAL LUMBAR INTERBODY FUSION (TLIF) WITH PEDICLE SCREW FIXATION L4-L5 1 LEVEL (N/A) Patient reports pain as 5 on 0-10 scale.   Denies CP or SOB.  Voiding without difficulty. Positive flatus.Ambulating in hall  Objective: Vital signs in last 24 hours: Temp:  [98.1 F (36.7 C)-100.9 F (38.3 C)] 100.9 F (38.3 C) (10/12 0400) Pulse Rate:  [68-103] 91 (10/12 0400) Resp:  [0-23] 18 (10/12 0400) BP: (114-159)/(65-97) 153/97 (10/12 0400) SpO2:  [93 %-100 %] 95 % (10/12 0400) Weight:  [103.4 kg (228 lb)] 103.4 kg (228 lb) (10/11 0739)  Intake/Output from previous day: 10/11 0701 - 10/12 0700 In: 3240 [P.O.:240; I.V.:3000] Out: 2205 [Urine:1755; Blood:450] Intake/Output this shift: No intake/output data recorded.  Labs: No results for input(s): HGB in the last 72 hours. No results for input(s): WBC, RBC, HCT, PLT in the last 72 hours. No results for input(s): NA, K, CL, CO2, BUN, CREATININE, GLUCOSE, CALCIUM in the last 72 hours. No results for input(s): LABPT, INR in the last 72 hours.  Physical Exam: Neurologically intact ABD soft Sensation intact distally Dorsiflexion/Plantar flexion intact Incision: no drainage Compartment soft  Assessment/Plan: 1 Day Post-Op Procedure(s) (LRB): TRANSFORAMINAL LUMBAR INTERBODY FUSION (TLIF) WITH PEDICLE SCREW FIXATION L4-L5 1 LEVEL (N/A) Advance diet Up with therapy  Discharge home after cleared by PT  Mayo, Baxter Kailarmen Christina for Dr. Venita Lickahari Brooks [facility] [phone] 05/19/2016, 7:19 AM    Patient ID: Phillip Hendrix, male   DOB: 04/24/1956, 60 y.o.   MRN: [id number]
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
## 2016-05-19 NOTE — Evaluation (Signed)
Occupational Therapy Evaluation/Discharge Patient Details Name: Phillip BuccoLarry E Hendrix MRN: [id number] DOB: 06/09/1956 Today's Date: 05/19/2016    History of Present Illness Pt is a 60 y.o male s/p transforaminal lumbar interbody fusion L4-5, posterolateral arthrodesis L 4-5, and gill decompression L4-5. Pt has a past medical history significant for lumbar spinal stenosis, degenerative lumbar disc, and sacroiliac joint pain.   Clinical Impression   PTA, pt was independent with all ADL and IADL and was working in Theatre stage managerquality control. Currently, pt requires min assist for LB ADL (to thread feet into socks and pants) and supervision for functional mobility. Education completed concerning back precautions during all ADL, use of AE to improve independence with ADL, log roll technique for bed mobility, appropriate back brace wear (at all times while out of bed and with shirt under brace), and no scrubbing over incision. Pt verbalizes and demonstrates understanding. Pt plans to D/C home with wife who can provide 24 hour assistance. OT feels that this is an appropriate D/C plan. Pt has no DME needs at this time. Pt has no further OT needs and OT signing off.    Follow Up Recommendations  No OT follow up;Supervision/Assistance - 24 hour    Equipment Recommendations  None recommended by OT       Precautions / Restrictions Precautions Precautions: Back Precaution Booklet Issued: Yes (comment) Precaution Comments: Educated pt on back precautions during ADL, proper positioning for sleep, log roll technique, brace wear schedule, and ADL transfers adhering to back precautions. Required Braces or Orthoses: Spinal Brace Spinal Brace: Lumbar corset;Applied in sitting position Restrictions Weight Bearing Restrictions: No      Mobility Bed Mobility Overal bed mobility: Needs Assistance Bed Mobility: Rolling;Supine to Sit;Sit to Supine Rolling: Supervision   Supine to sit: Supervision Sit to supine: Supervision      Transfers Overall transfer level: Needs assistance   Transfers: Sit to/from Stand Sit to Stand: Supervision         General transfer comment: VC's for adhering to no bending precautions.    Balance Overall balance assessment: Needs assistance Sitting-balance support: Feet supported Sitting balance-Leahy Scale: Good     Standing balance support: No upper extremity supported;During functional activity Standing balance-Leahy Scale: Good                              ADL Overall ADL's : Needs assistance/impaired     Grooming: Supervision/safety;Standing   Upper Body Bathing: Modified independent;Sitting   Lower Body Bathing: Minimal assistance;Sit to/from stand Lower Body Bathing Details (indicate cue type and reason): Min assist to reach feet. Pt reports wife will assist at home and he is not interested in compensatory techniques. Upper Body Dressing : Modified independent;Sitting   Lower Body Dressing: Minimal assistance Lower Body Dressing Details (indicate cue type and reason): Min assist to reach feet. Pt reports wife will assist at home and he is not interested in compensatory techniques. Toilet Transfer: Supervision/safety;BSC;Grab Agricultural consultantbars Toilet Transfer Details (indicate cue type and reason): VC's for adhering to back precautions. Toileting- Clothing Manipulation and Hygiene: Supervision/safety;Sit to/from stand;Cueing for back precautions   Tub/ Shower Transfer: Supervision/safety;Ambulation;Shower seat   Functional mobility during ADLs: Supervision/safety General ADL Comments: Educated pt on donning/doffing brace, wearing shirt under brace, back precautions during dressing/toilet transfer/shower transfer, use of wet-wipes to improve pericare, no scrubbing over incision site, and log roll technique for bed mobility.     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   Lower Extremity Assessment Lower Extremity Assessment: Overall WFL for tasks assessed       Communication Communication Communication: No difficulties   Cognition Arousal/Alertness: Awake/alert Behavior During Therapy: WFL for tasks assessed/performed Overall Cognitive Status: Within Functional Limits for tasks assessed                                Home Living Family/patient expects to be discharged to:: Private residence Living Arrangements: Spouse/significant other Available Help at Discharge: Family Type of Home: House Home Access: Stairs to enter Secretary/administrator of Steps: 1 Entrance Stairs-Rails: None Home Layout: One level     Bathroom Shower/Tub: Walk-in shower;Door   [employer] Toilet: Handicapped height Bathroom Accessibility: Yes   Home Equipment: Shower seat;Grab bars - toilet;Grab bars - tub/shower;Toilet riser;Hospital bed   Additional Comments: Pt reports recently renovating home apartment to accomdate for needs. He plans to stay in renovated rooms while recovering with handicapped toilet, shower seat, and hospital bed.      Prior Functioning/Environment Level of Independence: Independent                 OT Problem List: Decreased strength;Decreased activity tolerance;Decreased knowledge of use of DME or AE;Decreased knowledge of precautions;Pain      OT Goals(Current goals can be found in the care plan section) Acute Rehab OT Goals Patient Stated Goal: go back to work. OT Goal Formulation: With patient Time For Goal  Achievement: 06/02/16 Potential to Achieve Goals: Good   End of Session Equipment Utilized During Treatment: Back brace Nurse Communication: Other (comment) (No DME needs.)  Activity Tolerance: Patient tolerated treatment well Patient left: Other (comment) (Pt with transport team and going to X-ray following session.)   Time: 1610-9604 OT Time Calculation (min): 14 min Charges:  OT General Charges $OT Visit: 1 Procedure OT Evaluation $OT Eval Moderate Complexity: 1 Procedure  Doristine Section, OTR/L [phone] 05/19/2016, 9:06 AM

## 2016-05-19 NOTE — Progress Notes (Signed)
Patient alert and oriented, mae's well, voiding adequate amount of urine, swallowing without difficulty, c/o mild pain. Patient discharged home with family. Script and discharged instructions given to patient. Patient and family stated understanding of instructions given.   

## 2016-05-19 NOTE — Op Note (Signed)
[name]:  [name], [name]                ACCOUNT NO.:  [id number]  MEDICAL RECORD NO.:  [id number]  LOCATION:  3C06C                        FACILITY:  [facility]  PHYSICIAN:  Phillip Hendrix, M.D. DATE OF BIRTH:  [birth year]  DATE OF PROCEDURE:  05/18/2016 DATE OF DISCHARGE:                              OPERATIVE REPORT   PREOPERATIVE DIAGNOSES: 1. 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 2. 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  POSTOPERATIVE DIAGNOSES: 1. 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 2. 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  OPERATIVE PROCEDURE: 1. Transforaminal lumbar interbody fusion, L4-5. 2. Posterolateral arthrodesis, L4-5. 3. Gill decompression, L4-5.  FIRST ASSISTANT:  Phillip Hendrix, Phillip Hendrix.  HISTORY:  This is a very pleasant 60 year old gentleman who is having severe back, buttock, and bilateral neuropathic leg pain, left side worse than the right.  The patient had significant facet arthrosis and failed to improve with conservative management.  As a result, we elected to proceed with the aforementioned procedure.  All appropriate risks, benefits, and alternatives were explained to the patient and consent was obtained.  OPERATIVE NOTE:  The patient was brought to the operating room and placed on the operating table.  After successful induction of general anesthesia and endotracheal intubation, TEDs, SCDs, and a Foley were inserted.  The patient was then turned prone onto the Wilson frame and all bony prominences were well padded.  The neuromonitoring devices were attached, and the back was prepped and draped in standard fashion.  It should be noted that throughout the case, there were no abnormal EMG activity and all screws stimulated greater than 30 milliamps.  After time-out was taken, the patient was prepped and draped.  I began on the  right-hand side.  I identified the lateral borders of the L4 and L5 pedicle and then I made in simple incision connecting this.  A standard Wiltse posterolateral approach was taken.  I dissected down to the deep fascia, incised the deep fascia, and bluntly dissected through the paraspinal muscles.  Using a Jamshidi needle, I placed it along the lateral border of the facet complex.  I then advanced the Jamshidi needle while stimulating it through the pedicle into the vertebral body. Again, I used fluoro as well as the direct stimulation to confirm trajectory position and there was no breach.  With the L4 level placed, I then went to L5.  The starting position for this was quite difficult because of the significant facet arthropathy.  Using a Cobb, I removed the bulk of the osteophyte from the facet which allowed me to place the L5 pedicle of the L5 Jamshidi needle without significant difficulty. The guidepins were placed and both pedicles were now cannulated.  I then went to the left-hand side.  Because this was the leg that was having bulk of the problem, I elected to do Phillip TLIF from this side.  Using the same technique I had used on the contralateral side, I cannulated the L4 and L5 pedicles.  I then tapped over the guidepins and placed the 6.5 mm  diameter NuVasive pedicle screws.  At L4, it was a 50 mm length.  At L5, it was 45 mm a length.  I then assembled the retracting device and I could now visualize the posterolateral aspect of the spine.  I used an osteotome to remove the inferior L4 facet in its entirety which was significantly hypertrophied and arthritic.  I used a 3 and 4 mm Kerrison punch to perform a generous laminotomy of L4.  With the facet gone and the laminotomy complete, I used Phillip Penfield 4 to dissect through the ligamentum flavum and then I resected this to expose the posterolateral aspect of the thecal sac.  I then continued Phillip dissection into the lateral recess until  I identified the L5 nerve root.  Once this was identified, I took Phillip decompression resecting the medial overhanging osteophyte of the superior L5 facet back to the medial border of the pedicle.  I then went superiorly and I removed the pars to completely unroof the L4 nerve root.  Once I identified the L4 nerve root, I was satisfied with Phillip posterolateral decompression.  I could now visualize the L4 nerve root, the L5 nerve root.  I could freely palpate superiorly with Phillip [facility], hand out the L5 foramen.  Both nerve roots were adequately decompressed.  Neuro patties were then placed to protect the L4 and L5 nerve roots and the thecal sac.  I then incised the disk and using a combination of pituitary rongeurs, side cutting curettes, and in cutting curettes, I removed all the disk material from the L4-5 level.  Once I had a complete diskectomy, I then trialed and then placed a Conform allograft sheet along the anterior annulus and then placed a size 11 extra long titanium nanoLOCK intervertebral TLIF banana cage packed with local bone, autograft plus DBX.  This cage was well fitting. I was able to actually get it to the contralateral side to aid in Phillip contralateral decompression.  At this point, I was pleased with its position in the anterior third of the disk space.  I irrigated copiously with normal saline and using bipolar electrocautery and FloSeal, I obtained and maintained hemostasis.  At this point, I then applied the polyaxial heads and removed Phillip retracting device and then went back to the right-hand side.  Using an osteotome, I completed Phillip inferior L4 facetectomy.  Once this was done, I was able to resect the pars of L4 and complete Phillip foraminotomy of L4.  At this point, I was able to get a nerve hook down inferiorly, demonstrating an adequate lateral recess decompression and the L4 nerve root was completely decompressed as I had resected the pars.  I placed a  thrombin-soaked Gelfoam patty over the L4 nerve where the foraminotomy was completed, and then placed Phillip bone graft.  Prior to doing this, I did decorticate the articular cartilage of the L5 facet and I used the bone graft in the posterolateral gutter.  I then placed Phillip screws over the guidepins.  The distance was measured. Two rods were obtained, placed in the proper position, and then secured with the locking caps.  All caps were torqued off according manufactures standards.  Final imaging studies demonstrated satisfactory positioning of the intervertebral cage and all 4 pedicle screws.  Once again, all screws were individually tested and they all stimulated greater than 30 milliamps indicating no electrodiagnostic evidence of breach.  The wounds were then irrigated copiously with normal saline and closed sequentially with interrupted #1  Vicryl sutures, 2-0 Vicryl sutures, and a 3-0 Monocryl.  Steri-Strips and dry dressings were applied and the patient was ultimately extubated, transferred to PACU without incident.     Phillip Hendrix, M.D.     DDB/MEDQ  D:  05/18/2016  T:  05/19/2016  Job:  [id number]

## 2016-05-25 NOTE — Discharge Summary (Signed)
Physician Discharge Summary  Patient ID: Phillip Hendrix MRN: [id number] DOB/AGE: July 26, 1956 60 y.o.  Admit date: 05/18/2016 Discharge date: 05/25/2016  Admission Diagnoses:  Facet arthropathy, DDD and spinal stenosis L4-5  Discharge Diagnoses:  Active Problems:   Back pain   Past Medical History:  Diagnosis Date  . Allergy   . GERD (gastroesophageal reflux disease)   . History of hiatal hernia   . Hypertension   . Low back pain   . Seizure disorder (HCC)    07-2013, 1995    Surgeries: Procedure(s): TRANSFORAMINAL LUMBAR INTERBODY FUSION (TLIF) WITH PEDICLE SCREW FIXATION L4-L5 1 LEVEL on 05/18/2016   Consultants (if any):   Discharged Condition: Improved  Hospital Course: Phillip Hendrix is an 60 y.o. male who was admitted 05/18/2016 with a diagnosis of Facet Arthopathy, DDD, Spinal Stenosis L4-5 and went to the operating room on 05/18/2016 and underwent the above named procedures.  Post op day one pt reports decreased leg pain.  Pt reports incisional pain that is controlled on oral medication.  Pt urinating w/o difficulty.  Pt ambulating in the hallway.  Pt cleared by PT to be discharged.   He was given perioperative antibiotics:  Anti-infectives    Start     Dose/Rate Route Frequency Ordered Stop   05/18/16 2200  ceFAZolin (ANCEF) IVPB 1 g/50 mL premix     1 g 100 mL/hr over 30 Minutes Intravenous Every 8 hours 05/18/16 1721 05/19/16 0512   05/18/16 1415  ceFAZolin (ANCEF) IVPB 2g/100 mL premix  Status:  Discontinued     2 g 200 mL/hr over 30 Minutes Intravenous To Surgery 05/18/16 1413 05/18/16 1718    .  He was given sequential compression devices, early ambulation, and TED for DVT prophylaxis.  He benefited maximally from the hospital stay and there were no complications.    Recent vital signs:  Vitals:   05/19/16 0400 05/19/16 0748  BP: (!) 153/97 (!) 145/80  Pulse: 91 (!) 102  Resp: 18 18  Temp: (!) 100.9 F (38.3 C) 99.8 F (37.7 C)    Recent  laboratory studies:  Lab Results  Component Value Date   HGB 15.4 05/10/2016   Lab Results  Component Value Date   WBC 8.0 05/10/2016   PLT 149 (L) 05/10/2016   No results found for: INR Lab Results  Component Value Date   NA 134 (L) 05/10/2016   K 4.1 05/10/2016   CL 101 05/10/2016   CO2 25 05/10/2016   BUN 7 05/10/2016   CREATININE 0.90 05/10/2016   GLUCOSE 101 (H) 05/10/2016    Discharge Medications:     Medication List    STOP taking these medications   HYDROcodone-acetaminophen 10-325 MG tablet Commonly known as:  NORCO     TAKE these medications   allopurinol 100 MG tablet Commonly known as:  ZYLOPRIM TAKE 2 TABLETS BY MOUTH EVERY MORNING   loratadine 10 MG tablet Commonly known as:  CLARITIN Take 10 mg by mouth daily.   methocarbamol 500 MG tablet Commonly known as:  ROBAXIN Take 1 tablet (500 mg total) by mouth every 6 (six) hours as needed for muscle spasms.   ondansetron 4 MG disintegrating tablet Commonly known as:  ZOFRAN ODT Take 1 tablet (4 mg total) by mouth every 8 (eight) hours as needed for nausea or vomiting.   oxyCODONE-acetaminophen 10-325 MG tablet Commonly known as:  PERCOCET Take 1 tablet by mouth every 4 (four) hours as needed for pain.   pantoprazole 40  MG tablet Commonly known as:  PROTONIX Take 40 mg by mouth at bedtime.   Potassium 99 MG Tabs Take 99 mg by mouth daily.   zolpidem 10 MG tablet Commonly known as:  AMBIEN Take 10 mg by mouth at bedtime.       Diagnostic Studies: Dg Lumbar Spine 2-3 Views  Result Date: 05/19/2016 CLINICAL DATA:  60 year old male with back pain. Post fusion yesterday. Subsequent encounter. EXAM: LUMBAR SPINE - 2-3 VIEW COMPARISON:  Intraoperative exam 05/18/2016. FINDINGS: Post surgery L4-5 level with interbody spacer, bilateral pedicle screws and posterior connecting bar. On the lateral views, pedicle screws project towards the neural foramen raising possibility of pedicle screw extension  into foramen however, I suspect this is related to tilt on the current exam as the pedicle screws appear contained in the confines of the pedicles on the lateral intraoperative view. If postoperative complication is of high clinical concern or the patient has a radiculopathy then CT imaging may be considered for further delineation. Interbody spacer without subsiding or displacement. Bowel gas may be normal or related to mild postoperative ileus. Vascular calcifications. IMPRESSION: Post surgery L4-5 level with interbody spacer, bilateral pedicle screws and posterior connecting bar. On the lateral views, pedicle screws project towards the neural foramen raising possibility of pedicle screw extension into foramen however, I suspect this is related to tilt on the current exam as the pedicle screws appear contained in the confines of the pedicles on the lateral intraoperative view. If postoperative complication is of high clinical concern or the patient has a radiculopathy then CT imaging may be considered for further delineation. Bowel gas may be normal or related to postoperative mild ileus. Aortic atherosclerosis. Electronically Signed   By: Lacy DuverneySteven  Olson M.D.   On: 05/19/2016 09:05   Dg Lumbar Spine 2-3 Views  Result Date: 05/18/2016 CLINICAL DATA:  Lumbar fusion. EXAM: LUMBAR SPINE - 2-3 VIEW; DG C-ARM GT 120 MIN COMPARISON:  None. FINDINGS: Fluoroscopic spot images demonstrate well-positioned pedicle screws, posterior rods and interbody fusion device at L4-5. No complicating features are demonstrated. IMPRESSION: Well-positioned L4-5 fusion hardware.  No complicating features. Electronically Signed   By: Rudie MeyerP.  Gallerani M.D.   On: 05/18/2016 14:41   Dg C-arm Gt 120 Min  Result Date: 05/18/2016 CLINICAL DATA:  Lumbar fusion. EXAM: LUMBAR SPINE - 2-3 VIEW; DG C-ARM GT 120 MIN COMPARISON:  None. FINDINGS: Fluoroscopic spot images demonstrate well-positioned pedicle screws, posterior rods and interbody fusion  device at L4-5. No complicating features are demonstrated. IMPRESSION: Well-positioned L4-5 fusion hardware.  No complicating features. Electronically Signed   By: Rudie MeyerP.  Gallerani M.D.   On: 05/18/2016 14:41    Disposition: 01-Home or Self Care Post op medication provided Pt will present in clinic in 2 weeks   Follow-up Information    Alvy BealBROOKS,DAHARI D, MD .   Specialty:  Orthopedic Surgery Contact information: [address] [place] [address] [phone]            Signed: Kirt BoysMayo, Twisha Vanpelt Christina 05/25/2016, 7:59 AM

## 2017-03-22 IMAGING — DX DG LUMBAR SPINE 2-3V
3 series · 3 of 3 positions shown · non-contrast
Comparison: Intraoperative exam 05/18/2016.

CLINICAL DATA: 60-year-old male with back pain. Post fusion
yesterday. Subsequent encounter.

EXAM:
LUMBAR SPINE - 2-3 VIEW

[t lumbar spine ap]
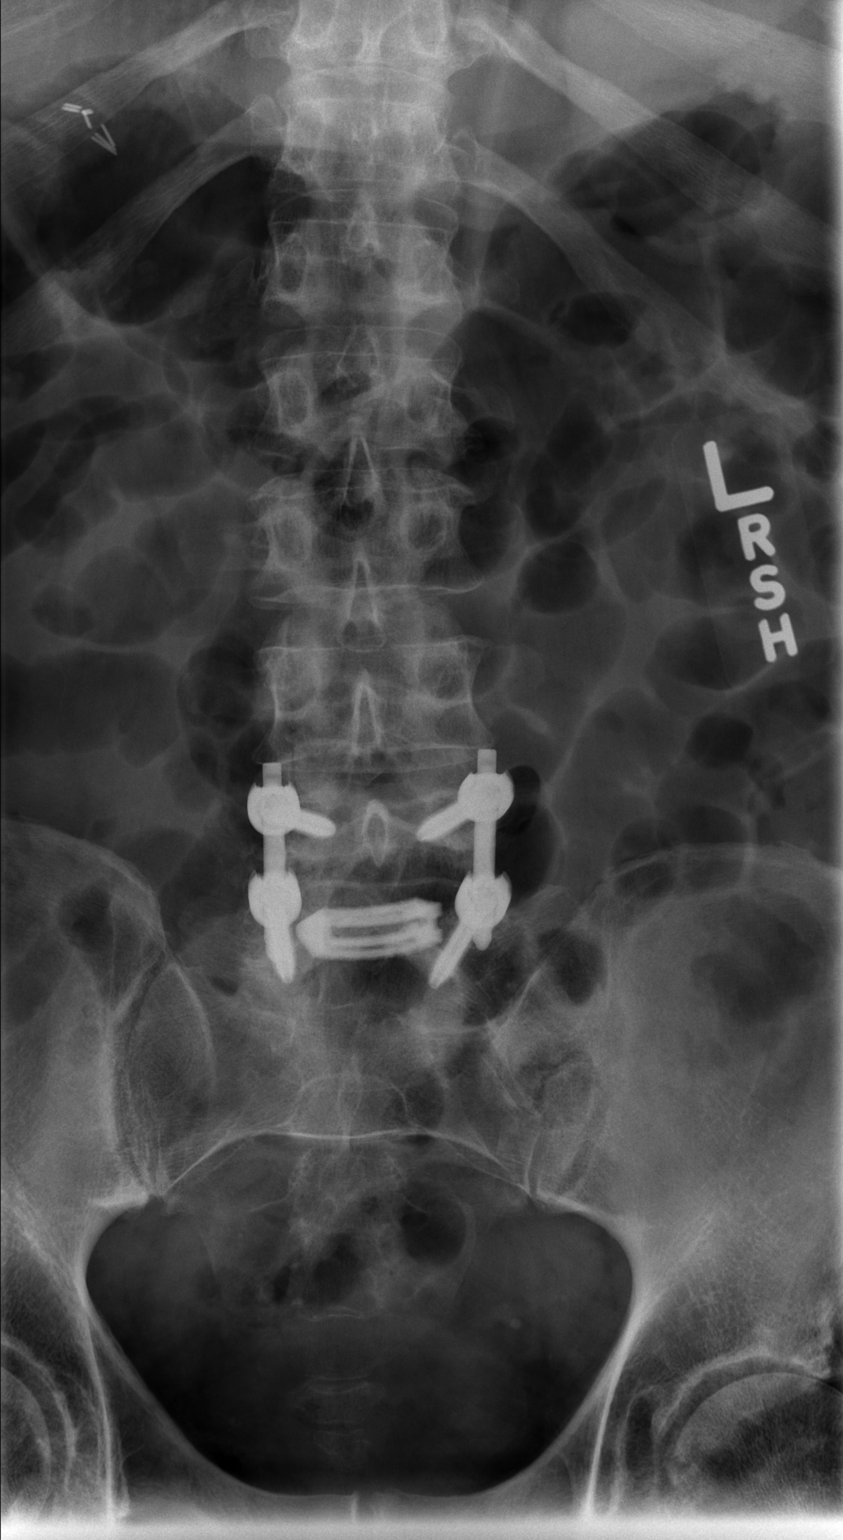

[t lumbar spine lat]
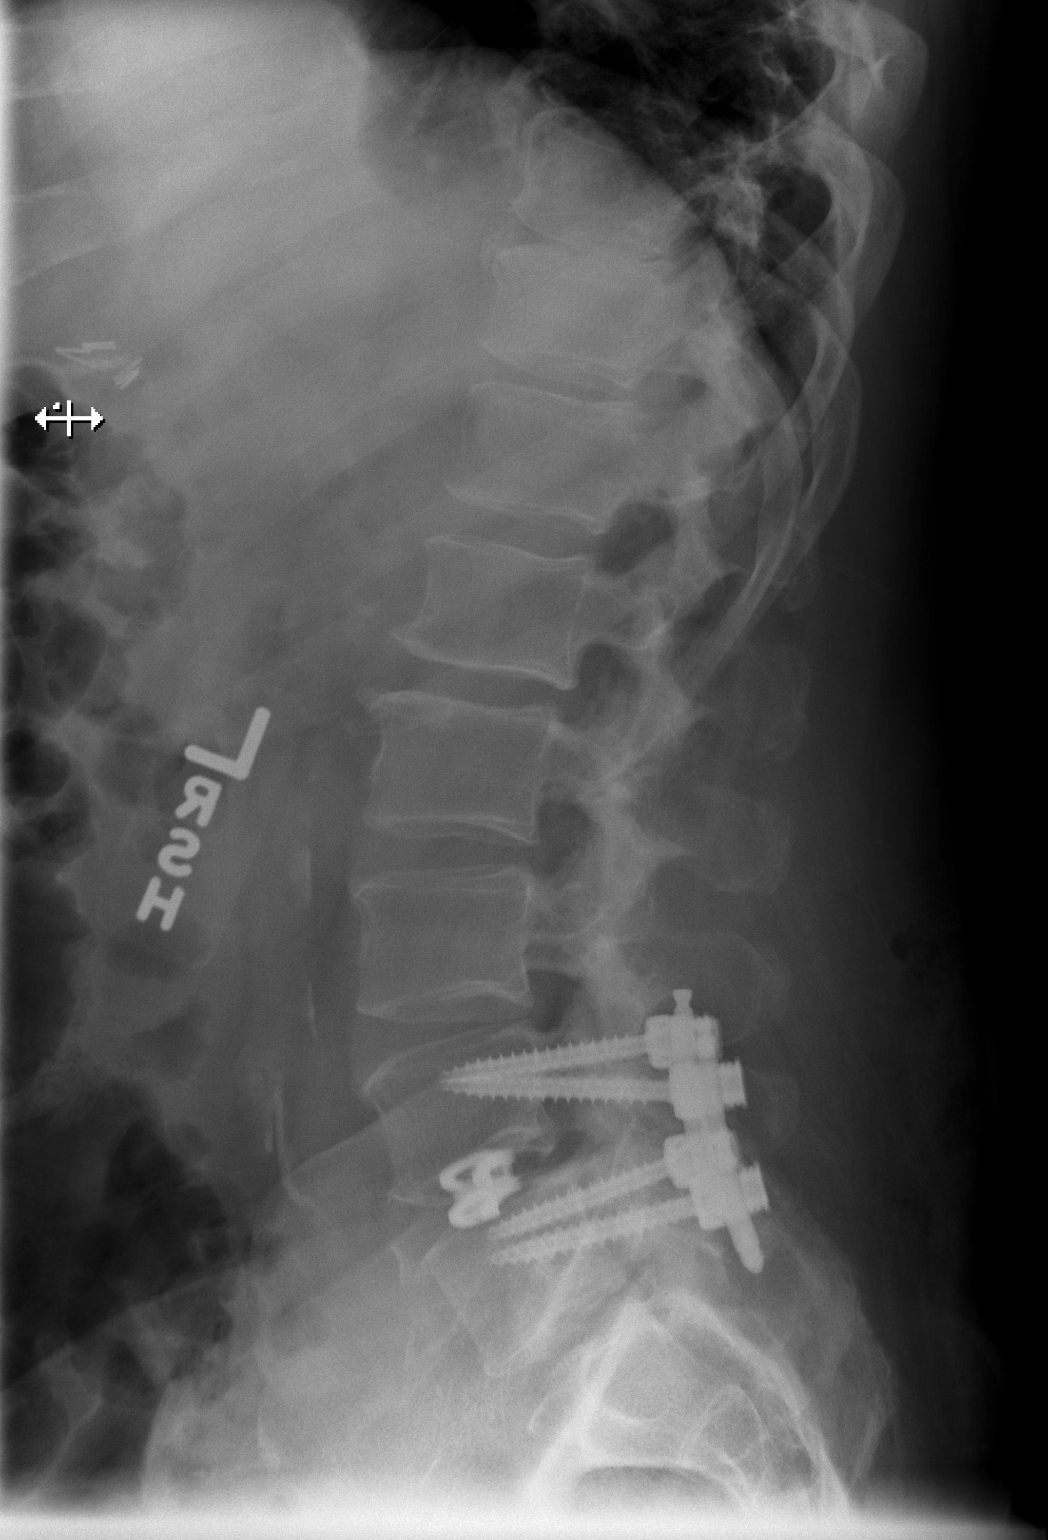

[t lumbar l-5 s-1 spot]
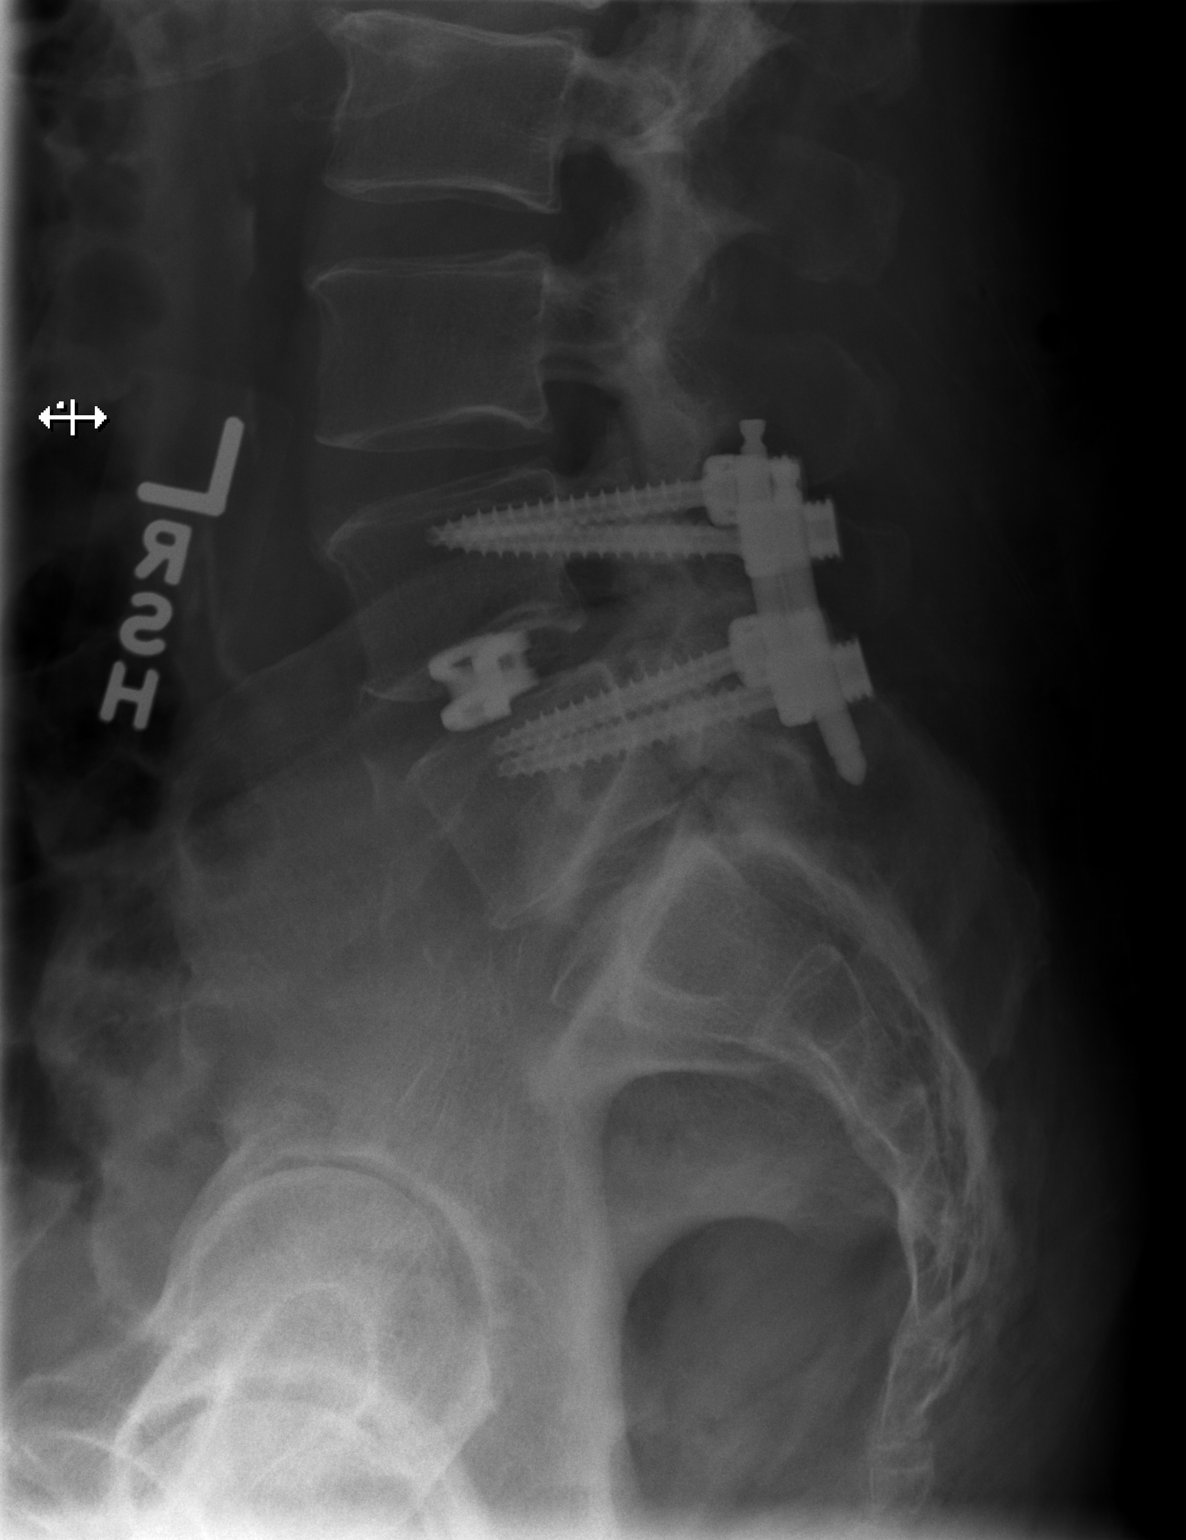

[3 of 3 positions shown; findings below may reference images not displayed]

FINDINGS: Post surgery L4-5 level with interbody spacer, bilateral pedicle
screws and posterior connecting bar. On the lateral views, pedicle
screws project towards the neural foramen raising possibility of
pedicle screw extension into foramen however, I suspect this is
related to tilt on the current exam as the pedicle screws appear
contained in the confines of the pedicles on the lateral
intraoperative view. If postoperative complication is of high
clinical concern or the patient has a radiculopathy then CT imaging
may be considered for further delineation.

Interbody spacer without subsiding or displacement.

Bowel gas may be normal or related to mild postoperative ileus.

Vascular calcifications.
IMPRESSION: Post surgery L4-5 level with interbody spacer, bilateral pedicle
screws and posterior connecting bar. On the lateral views, pedicle
screws project towards the neural foramen raising possibility of
pedicle screw extension into foramen however, I suspect this is
related to tilt on the current exam as the pedicle screws appear
contained in the confines of the pedicles on the lateral
intraoperative view. If postoperative complication is of high
clinical concern or the patient has a radiculopathy then CT imaging
may be considered for further delineation.

Bowel gas may be normal or related to postoperative mild ileus.

Aortic atherosclerosis.

## 2019-02-15 ENCOUNTER — Ambulatory Visit: Payer: Self-pay | Admitting: Orthopedic Surgery

## 2019-02-25 ENCOUNTER — Ambulatory Visit: Payer: Self-pay | Admitting: Orthopedic Surgery

## 2019-02-25 NOTE — H&P (Signed)
TOTAL HIP ADMISSION H&P  Patient is admitted for left total hip arthroplasty.  Subjective:  Chief Complaint: left hip pain  HPI: Phillip Hendrix, 63 y.o. 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  Onset of symptoms was gradual starting 2 years ago with rapidlly worsening course since that time.The patient noted no past surgery on the left hip(s).  Patient currently rates pain in the left hip at 10 out of 10 with activity. Patient has night pain, worsening of pain with activity and weight bearing, trendelenberg gait, pain that interfers with activities of daily living, pain with passive range of motion and crepitus. Patient has evidence of subchondral cysts, subchondral sclerosis, periarticular osteophytes, joint space narrowing and femoral head collapse by imaging studies. This condition presents safety issues increasing the risk of falls.  There is no current active infection.  Patient Active Problem List   Diagnosis Date Noted  . Back pain 05/18/2016   Past Medical History:  Diagnosis Date  . Allergy   . GERD (gastroesophageal reflux disease)   . History of hiatal hernia   . Hypertension   . Low back pain   . Seizure disorder (HCC)    07-2013, 1995    Past Surgical History:  Procedure Laterality Date  . [facility] Left 11/1998   [facility]   . APPENDECTOMY  1972  . CARDIAC CATHETERIZATION     states 10 or more years ago  . CHOLECYSTECTOMY     07/2012  . COLONOSCOPY    . TOE SURGERY    . WRIST SURGERY Right     Current Outpatient Medications  Medication Sig Dispense Refill Last Dose  . allopurinol (ZYLOPRIM) 100 MG tablet TAKE 2 TABLETS BY  MOUTH EVERY MORNING  11 05/17/2016 at Unknown time  . loratadine (CLARITIN) 10 MG tablet Take 10 mg by mouth daily.    05/17/2016 at Unknown time  . methocarbamol (ROBAXIN) 500 MG tablet Take 1 tablet (500 mg total) by mouth every 6 (six) hours as needed for muscle spasms. 40 tablet 0   . ondansetron (ZOFRAN ODT) 4 MG disintegrating tablet Take 1 tablet (4 mg total) by mouth every 8 (eight) hours as needed for nausea or vomiting. 20 tablet 0   . oxyCODONE-acetaminophen (PERCOCET) 10-325 MG tablet Take 1 tablet by mouth every 4 (four) hours as needed for pain. 60 tablet 0   . pantoprazole (PROTONIX) 40 MG tablet Take 40 mg by mouth at bedtime.   3 05/17/2016 at Unknown time  . Potassium 99 MG TABS Take 99 mg by mouth daily.    05/17/2016 at Unknown time  . zolpidem (AMBIEN) 10 MG tablet Take 10 mg by mouth at bedtime.  5 05/17/2016 at Unknown time   No current facility-administered medications for this visit.    Allergies  Allergen Reactions  . Celecoxib Rash    Social History   Tobacco Use  . Smoking status: Never Smoker  . Smokeless tobacco: Never Used  Substance Use Topics  . Alcohol use: No    Family History  Problem Relation Age of Onset  . Heart disease Mother   . Cancer Sister   . Diabetes Sister   . Diabetes Brother   . Cancer  Maternal Grandfather      Review of Systems  Constitutional: Negative.   HENT: Negative.   Eyes: Negative.   Respiratory: Negative.   Cardiovascular: Negative.   Gastrointestinal: Negative.   Genitourinary: Negative.   Musculoskeletal: Positive for joint pain.  Skin: Negative.   Neurological: Negative.   Endo/Heme/Allergies: Negative.   Psychiatric/Behavioral: Negative.     Objective:  Physical Exam  Musculoskeletal:     Left hip: He exhibits decreased range of motion and decreased strength.    Vital signs in last 24 hours: @VSRANGES @  Labs:   Estimated body mass index is 34.16 kg/m as calculated from the following:   Height  as of 05/18/16: 5' 8.5" (1.74 m).   Weight as of 05/18/16: 103.4 kg.   Imaging Review Plain radiographs demonstrate severe degenerative joint disease of the left hip(s). The bone quality appears to be adequate for age and reported activity level.      Assessment/Plan:  AVN / End stage arthritis, left hip(s)  The patient history, physical examination, clinical judgement of the provider and imaging studies are consistent with end stage degenerative joint disease of the left hip(s) and total hip arthroplasty is deemed medically necessary. The treatment options including medical management, injection therapy, arthroscopy and arthroplasty were discussed at length. The risks and benefits of total hip arthroplasty were presented and reviewed. The risks due to aseptic loosening, infection, stiffness, dislocation/subluxation,  thromboembolic complications and other imponderables were discussed.  The patient acknowledged the explanation, agreed to proceed with the plan and consent was signed. Patient is being admitted for inpatient treatment for surgery, pain control, PT, OT, prophylactic antibiotics, VTE prophylaxis, progressive ambulation and ADL's and discharge planning.The patient is planning to be discharged home with HEP.  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 Has pending PET scan for Neck mass.    Patient's anticipated LOS is less than 2 midnights, meeting these requirements: - Younger than 79 - Lives within 1 hour of care - Has a competent adult at home to recover with post-op recover - NO history of  - Chronic pain requiring opiods  - Diabetes  - Coronary Artery Disease  - Heart failure  - Heart attack  - Stroke  - DVT/VTE  - Cardiac arrhythmia  - Respiratory Failure/COPD  - Renal failure  - Anemia  - Advanced Liver disease

## 2019-03-07 ENCOUNTER — Inpatient Hospital Stay (HOSPITAL_COMMUNITY)
Admission: RE | Admit: 2019-03-07 | Payer: BLUE CROSS/BLUE SHIELD | Source: Home / Self Care | Admitting: Orthopedic Surgery

## 2019-03-07 ENCOUNTER — Encounter (HOSPITAL_COMMUNITY): Admission: RE | Payer: Self-pay | Source: Home / Self Care

## 2019-03-07 SURGERY — ARTHROPLASTY, HIP, TOTAL, ANTERIOR APPROACH
Anesthesia: Spinal | Laterality: Left
